# Patient Record
Sex: Female | Born: 1960 | Race: White | Hispanic: No | Marital: Single | Smoking: Never smoker
Health system: Southern US, Community
[De-identification: ages and names within clinical notes are randomized; demographics above are authoritative.]

## PROBLEM LIST (undated history)

## (undated) DIAGNOSIS — F32A Depression, unspecified: Secondary | ICD-10-CM

## (undated) DIAGNOSIS — F329 Major depressive disorder, single episode, unspecified: Secondary | ICD-10-CM

## (undated) HISTORY — DX: Depression, unspecified: F32.A

## (undated) HISTORY — PX: HERNIA REPAIR: SHX51

## (undated) HISTORY — DX: Major depressive disorder, single episode, unspecified: F32.9

## (undated) HISTORY — PX: CHOLECYSTECTOMY: SHX55

## (undated) HISTORY — PX: TEMPOROMANDIBULAR JOINT SURGERY: SHX35

## (undated) HISTORY — PX: APPENDECTOMY: SHX54

## (undated) HISTORY — PX: EYE SURGERY: SHX253

---

## 2016-12-18 ENCOUNTER — Emergency Department (INDEPENDENT_AMBULATORY_CARE_PROVIDER_SITE_OTHER)
Admission: EM | Admit: 2016-12-18 | Discharge: 2016-12-18 | Disposition: A | Discharge status: Home / Self Care | Payer: PRIVATE HEALTH INSURANCE | Source: Home / Self Care | Attending: Family Medicine | Admitting: Family Medicine

## 2016-12-18 DIAGNOSIS — L03313 Cellulitis of chest wall: Secondary | ICD-10-CM | POA: Diagnosis not present

## 2016-12-18 LAB — POCT CBC W AUTO DIFF (K'VILLE URGENT CARE)

## 2016-12-18 MED ORDER — CLINDAMYCIN HCL 300 MG PO CAPS: 300.0000 mg | ORAL_CAPSULE | Freq: Three times a day (TID) | ORAL | 0 refills | Status: AC

## 2016-12-18 MED ORDER — HYDROCODONE-ACETAMINOPHEN 5-325 MG PO TABS: 1.0000 | ORAL_TABLET | Freq: Four times a day (QID) | ORAL | 0 refills | Status: DC | PRN

## 2016-12-18 NOTE — Discharge Instructions (Signed)
Apply heating pad several times daily. If symptoms become significantly worse during the night or over the weekend, proceed to the local emergency room.

## 2016-12-18 NOTE — ED Provider Notes (Signed)
[name]    CSN: [id number] Arrival date & time: 12/18/16  1537     History   Chief Complaint Chief Complaint  Patient presents with  . Sore    under right arm    HPI Ellen Hammond is a 56 y.o. female.   Patient awoke two days ago with a sore area on her right lateral chest beneath her right axilla.  The area has gradually become more painful and red.  There has been no drainage from the area.  She has not felt well today.   The history is provided by the patient.  Abscess  Location:  Torso Torso abscess location:  R chest Size:  10cm by 6cm Abscess quality: induration, painful, redness and warmth   Abscess quality: not draining, no fluctuance, no itching and not weeping   Red streaking: no   Duration:  2 days Progression:  Worsening Pain details:    Quality:  Dull and pressure   Severity:  Mild   Duration:  2 days   Timing:  Constant   Progression:  Worsening Chronicity:  New Context: not skin injury   Relieved by:  Nothing Exacerbated by: contact. Ineffective treatments:  Topical antibiotics Associated symptoms: anorexia and fatigue   Associated symptoms: no fever, no headaches, no nausea and no vomiting     History reviewed. No pertinent past medical history.  There are no active problems to display for this patient.   Past Surgical History:  Procedure Laterality Date  . APPENDECTOMY    . CHOLECYSTECTOMY    . EYE SURGERY    . HERNIA REPAIR    . TEMPOROMANDIBULAR JOINT SURGERY      OB History    No data available       Home Medications    Prior to Admission medications   Medication Sig Start Date End Date Taking? Authorizing Provider  esomeprazole (NEXIUM) 10 MG packet Take 10 mg by mouth daily before breakfast.   Yes [provider]  clindamycin (CLEOCIN) 300 MG capsule Take 1 capsule (300 mg total) by mouth 3 (three) times daily. (every 8 hours) 12/18/16 12/28/16  Kandra Nicolas, MD  HYDROcodone-acetaminophen  (NORCO/VICODIN) 5-325 MG tablet Take 1 tablet by mouth every 6 (six) hours as needed for moderate pain. 12/18/16   Kandra Nicolas, MD    Family History Family History  Problem Relation Age of Onset  . Diabetes Mother   . Hypertension Mother   . Epilepsy Sister     Social History Social History  Substance Use Topics  . Smoking status: Never Smoker  . Smokeless tobacco: Never Used  . Alcohol use No     Allergies   Erythromycin and Penicillins   Review of Systems Review of Systems  Constitutional: Positive for fatigue. Negative for fever.  Gastrointestinal: Positive for anorexia. Negative for nausea and vomiting.  Neurological: Negative for headaches.  All other systems reviewed and are negative.    Physical Exam Triage Vital Signs ED Triage Vitals  Enc Vitals Group     BP 12/18/16 1605 (!) 153/82     Pulse Rate 12/18/16 1605 88     Resp --      Temp 12/18/16 1605 99.2 F (37.3 C)     Temp Source 12/18/16 1605 Oral     SpO2 12/18/16 1605 96 %     Weight 12/18/16 1605 238 lb (108 kg)     Height 12/18/16 1605 5\' 4"  (1.626 m)  Head Circumference --      Peak Flow --      Pain Score 12/18/16 1606 8     Pain Loc --      Pain Edu? --      Excl. in [place]? --    No data found.   Updated Vital Signs BP (!) 153/82 (BP Location: Left Arm)   Pulse 88   Temp 99.2 F (37.3 C) (Oral)   Ht 5\' 4"  (1.626 m)   Wt 238 lb (108 kg)   SpO2 96%   BMI 40.85 kg/m   Visual Acuity Right Eye Distance:   Left Eye Distance:   Bilateral Distance:    Right Eye Near:   Left Eye Near:    Bilateral Near:     Physical Exam  Constitutional: She appears well-developed and well-nourished. No distress.  HENT:  Head: Normocephalic.  Right Ear: External ear normal.  Left Ear: External ear normal.  Nose: Nose normal.  Mouth/Throat: Oropharynx is clear and moist.  Eyes: Conjunctivae are normal. Pupils are equal, round, and reactive to light.  Neck: Neck supple.  Cardiovascular:  Normal rate.   Pulmonary/Chest: Effort normal. She exhibits tenderness.    Right lateral chest immediately inferior to the right axilla has an indurated warm erythematous tender area about 6cm by 10cm.  No fluctuance, and minimal swelling.  No axillary adenopathy.  Lymphadenopathy:    She has no cervical adenopathy.  Neurological: She is alert.  Skin: Skin is warm and dry.  Nursing note and vitals reviewed.    UC Treatments / Results  Labs (all labs ordered are listed, but only abnormal results are displayed) Labs Reviewed  POCT CBC W AUTO DIFF ([place]):  WBC 15.1; LY 15.2; MO 4.7; GR 80.1; Hgb 12.9; Platelets 270     EKG  EKG Interpretation None       Radiology No results found.  Procedures Procedures (including critical care time)  Medications Ordered in UC Medications - No data to display   Initial Impression / Assessment and Plan / UC Course  I have reviewed the triage vital signs and the nursing notes.  Pertinent labs & imaging results that were available during my care of the patient were reviewed by me and considered in my medical decision making (see chart for details).    Note leukocytosis 15.1; lesion is indurated but not fluctuant (may eventually need I and D). Begin clindamycin 300mg  TID for staph coverage. Rx for Lortab for pain Apply heating pad several times daily. If symptoms become significantly worse during the night or over the weekend, proceed to the local emergency room.  Followup with Family Doctor if not improved in about 5 days.    Final Clinical Impressions(s) / UC Diagnoses   Final diagnoses:  Cellulitis of chest wall    New Prescriptions New Prescriptions   CLINDAMYCIN (CLEOCIN) 300 MG CAPSULE    Take 1 capsule (300 mg total) by mouth 3 (three) times daily. (every 8 hours)   HYDROCODONE-ACETAMINOPHEN (NORCO/VICODIN) 5-325 MG TABLET    Take 1 tablet by mouth every 6 (six) hours as needed for moderate pain.       Kandra Nicolas, MD 12/18/16 [phone]

## 2016-12-18 NOTE — ED Triage Notes (Signed)
Pt stated that spot appeared under right arm on Tuesday.  It is hot, red, painful, but denies drainage.  Area of redness is approximately 4"x4".  Pt has tried cortisone cream and is taking aleve for the pain.

## 2016-12-19 ENCOUNTER — Emergency Department: Admission: EM | Admit: 2016-12-19 | Discharge: 2016-12-19 | Discharge status: Absent without leave | Payer: Self-pay

## 2016-12-22 ENCOUNTER — Telehealth: Payer: Self-pay | Admitting: *Deleted

## 2016-12-22 NOTE — Telephone Encounter (Signed)
Pt called reports that her abscess is more swollen, red and hard to the touch. Advised her to return to the clinic for reevaluation. Pt agrees.

## 2017-02-11 ENCOUNTER — Emergency Department (INDEPENDENT_AMBULATORY_CARE_PROVIDER_SITE_OTHER): Payer: PRIVATE HEALTH INSURANCE

## 2017-02-11 ENCOUNTER — Encounter: Payer: Self-pay | Admitting: Emergency Medicine

## 2017-02-11 ENCOUNTER — Emergency Department (INDEPENDENT_AMBULATORY_CARE_PROVIDER_SITE_OTHER)
Admission: EM | Admit: 2017-02-11 | Discharge: 2017-02-11 | Disposition: A | Discharge status: Home / Self Care | Payer: PRIVATE HEALTH INSURANCE | Source: Home / Self Care | Attending: Family Medicine | Admitting: Family Medicine

## 2017-02-11 DIAGNOSIS — K760 Fatty (change of) liver, not elsewhere classified: Secondary | ICD-10-CM

## 2017-02-11 DIAGNOSIS — R1031 Right lower quadrant pain: Secondary | ICD-10-CM | POA: Diagnosis not present

## 2017-02-11 DIAGNOSIS — N200 Calculus of kidney: Secondary | ICD-10-CM

## 2017-02-11 LAB — POCT URINALYSIS DIP (MANUAL ENTRY)
Glucose, UA: NEGATIVE mg/dL
Leukocytes, UA: NEGATIVE
NITRITE UA: NEGATIVE
PH UA: 5.5 (ref 5.0–8.0)
Protein Ur, POC: 100 mg/dL — AB
Spec Grav, UA: >=1.03 — AB (ref 1.010–1.025)
Urobilinogen, UA: 0.2 E.U./dL

## 2017-02-11 LAB — POCT CBC W AUTO DIFF (K'VILLE URGENT CARE)

## 2017-02-11 MED ORDER — KETOROLAC TROMETHAMINE 60 MG/2ML IM SOLN
60.0000 mg | Freq: Once | INTRAMUSCULAR | Status: AC
  Administered 2017-02-11: 60 mg via INTRAMUSCULAR

## 2017-02-11 MED ORDER — IOPAMIDOL (ISOVUE-300) INJECTION 61%: 100.0000 mL | Freq: Once | INTRAVENOUS | Status: DC | PRN

## 2017-02-11 NOTE — ED Triage Notes (Signed)
Lower Right Quadrant pain since yesterday. Might be constipated, slight urinary problems?

## 2017-02-11 NOTE — ED Provider Notes (Addendum)
[facility]    CSN: [id number] Arrival date & time: 02/11/17  1152     History   Chief Complaint Chief Complaint  Patient presents with  . Abdominal Pain    HPI Ellen Hammond is a 56 y.o. female.   At 3 pm yesterday patient developed crampy non-radiating right lower abdominal pain.  The pain has persisted and is worse when walking, and better when still.  She has had sweats, and nausea without vomiting.  Her pain has not been related to eating.  She had normal bowel movements until yesterday (none since then), and no urinary symptoms.  Her urine has been lighter than normal.  She had similar pain one month ago that lasted about 3 hours.  Her pain has not improved after taking Pepto Bismol, Nexium, and Aleve. She has a past history of appendectomy and cholecystectomy.  She has a hiatal hernia.  She is 5 years post-menopausal.   The history is provided by the patient.  Abdominal Pain  Pain location:  RLQ Pain quality: cramping   Pain radiates to:  Does not radiate Pain severity:  Mild Onset quality:  Sudden Duration:  1 day Timing:  Constant Progression:  Unchanged Chronicity:  Recurrent Context: previous surgery   Context: not alcohol use, not awakening from sleep, not diet changes, not eating, not laxative use, not medication withdrawal, not recent illness, not recent travel, not sick contacts, not suspicious food intake and not trauma   Relieved by:  Nothing Exacerbated by: walking. Ineffective treatments:  NSAIDs and OTC medications Associated symptoms: chills and nausea   Associated symptoms: no anorexia, no belching, no chest pain, no constipation, no cough, no diarrhea, no dysuria, no fatigue, no fever, no flatus, no hematemesis, no hematochezia, no hematuria, no melena, no shortness of breath, no sore throat, no vaginal bleeding, no vaginal discharge and no vomiting   Risk factors: multiple surgeries and obesity     History reviewed. No pertinent past  medical history.  There are no active problems to display for this patient.   Past Surgical History:  Procedure Laterality Date  . APPENDECTOMY    . CHOLECYSTECTOMY    . EYE SURGERY    . HERNIA REPAIR    . TEMPOROMANDIBULAR JOINT SURGERY      OB History    No data available       Home Medications    Prior to Admission medications   Medication Sig Start Date End Date Taking? Authorizing Provider  esomeprazole (NEXIUM) 10 MG packet Take 10 mg by mouth daily before breakfast.    [provider]    Family History Family History  Problem Relation Age of Onset  . Diabetes Mother   . Hypertension Mother   . Epilepsy Sister     Social History Social History  Substance Use Topics  . Smoking status: Never Smoker  . Smokeless tobacco: Never Used  . Alcohol use No     Allergies   Erythromycin and Penicillins   Review of Systems Review of Systems  Constitutional: Positive for chills. Negative for fatigue and fever.  HENT: Negative for sore throat.   Respiratory: Negative for cough and shortness of breath.   Cardiovascular: Negative for chest pain.  Gastrointestinal: Positive for abdominal pain and nausea. Negative for anorexia, constipation, diarrhea, flatus, hematemesis, hematochezia, melena and vomiting.  Genitourinary: Negative for dysuria, hematuria, vaginal bleeding and vaginal discharge.     Physical Exam Triage Vital Signs ED Triage Vitals  Enc Vitals  Group     BP 02/11/17 1228 (!) 158/99     Pulse Rate 02/11/17 1228 81     Resp --      Temp 02/11/17 1228 98.3 F (36.8 C)     Temp Source 02/11/17 1228 Oral     SpO2 02/11/17 1228 96 %     Weight 02/11/17 1229 242 lb (109.8 kg)     Height 02/11/17 1229 5\' 4"  (1.626 m)     Head Circumference --      Peak Flow --      Pain Score 02/11/17 1229 8     Pain Loc --      Pain Edu? --      Excl. in [place]? --    No data found.   Updated Vital Signs BP (!) 158/99 (BP Location: Left Arm)   Pulse  81   Temp 98.3 F (36.8 C) (Oral)   Ht 5\' 4"  (1.626 m)   Wt 242 lb (109.8 kg)   SpO2 96%   BMI 41.54 kg/m   Visual Acuity Right Eye Distance:   Left Eye Distance:   Bilateral Distance:    Right Eye Near:   Left Eye Near:    Bilateral Near:     Physical Exam  Constitutional: She appears well-developed and well-nourished. No distress.  HENT:  Head: Normocephalic.  Right Ear: External ear normal.  Left Ear: External ear normal.  Nose: Nose normal.  Mouth/Throat: Oropharynx is clear and moist.  Eyes: Pupils are equal, round, and reactive to light. Conjunctivae and EOM are normal.  Neck: Neck supple.  Cardiovascular: Normal heart sounds.   Pulmonary/Chest: Breath sounds normal.  Abdominal: Soft. Bowel sounds are normal. She exhibits no distension and no mass. There is no hepatosplenomegaly. There is tenderness. There is no rigidity, no rebound, no guarding, no tenderness at McBurney's point and negative Murphy's sign. No hernia.    Mild right flank tenderness also present.  Musculoskeletal: She exhibits no edema.  Lymphadenopathy:    She has no cervical adenopathy.  Neurological: She is alert.  Skin: Skin is warm and dry. No rash noted.  Nursing note and vitals reviewed.    UC Treatments / Results  Labs (all labs ordered are listed, but only abnormal results are displayed) Labs Reviewed  POCT CBC W AUTO DIFF ([facility]):  WBC 9.1; LY 29.0; MO 3.8; GR 67.2; Hgb 13.7; Platelets 284 POCT urinalysis:  GLU negative, BIL small, KET trace, SG >= 1.030, BLO trace intact, pH 5.5, PRO 100mg /dL, URO 0.2 E.U./dL, NIT negative, LEU negative.     EKG  EKG Interpretation None       Radiology Study Result   CLINICAL DATA:  Left lower quadrant pain and tenderness.  EXAM: CT ABDOMEN AND PELVIS WITHOUT CONTRAST  TECHNIQUE: Multidetector CT imaging of the abdomen and pelvis was performed following the standard protocol without IV contrast.  COMPARISON:   None.  FINDINGS: Lower chest: Unremarkable.  Hepatobiliary: The liver shows diffusely decreased attenuation suggesting steatosis. No focal abnormality within the liver parenchyma. Gallbladder surgically absent. No intrahepatic or extrahepatic biliary dilation.  Pancreas: No focal mass lesion. No dilatation of the main duct. No intraparenchymal cyst. No peripancreatic edema.  Spleen: No splenomegaly. No focal mass lesion.  Adrenals/Urinary Tract: No adrenal nodule or mass. Right kidney unremarkable. 2 mm nonobstructing interpolar left renal stone has an adjacent 1 mm nonobstructing stone. No evidence for hydroureter. The urinary bladder appears normal for the degree of distention.  Stomach/Bowel:  Small hiatal hernia. Stomach otherwise unremarkable. Duodenum is normally positioned as is the ligament of Treitz. No small bowel wall thickening. No small bowel dilatation. The terminal ileum is normal. The appendix is not visualized, but there is no edema or inflammation in the region of the cecum. Left colon is decompressed, but no pericolonic edema or inflammation evident.  Vascular/Lymphatic: No abdominal aortic aneurysm. There is no gastrohepatic or hepatoduodenal ligament lymphadenopathy. No intraperitoneal or retroperitoneal lymphadenopathy. No pelvic sidewall lymphadenopathy.  Reproductive: Uterus normal in appearance. There is no adnexal mass.  Other: Trace amount of free fluid is identified in the cul-de-sac.  Musculoskeletal: Bone windows reveal no worrisome lytic or sclerotic osseous lesions. Bilateral groin hernias contain only fat.  IMPRESSION: 1. No acute findings in the abdomen or pelvis. 2. Trace free fluid in the cul-de-sac. This can be physiologic in a pre or perimenopausal female but is considered abnormal in a postmenopausal patient. Etiology for the fluid not evident on the current CT scan. 3. Hepatic steatosis. 4. Nonobstructing left  nephrolithiasis. 5. Bilateral groin hernias contain only fat.   Electronically Signed   By: Misty Stanley M.D.   On: 02/11/2017 14:28     Procedures Procedures (including critical care time)  Medications Ordered in UC Medications  ketorolac (TORADOL) injection 60 mg (60 mg Intramuscular Given 02/11/17 1709)     Initial Impression / Assessment and Plan / UC Course  I have reviewed the triage vital signs and the nursing notes.  Pertinent labs & imaging results that were available during my care of the patient were reviewed by me and considered in my medical decision making (see chart for details).    Normal CBC reassuring. Urinalysis non-specific but not normal (small BIL, trace KET, trace BLO, PRO 100mg /dL) Concern for trace free fluid in the cul-de-sac without obvious cause in a postmenopausal patient. Rx for Mobic 15mg , one daily (hand written) and Lortab 5-325, one Q6hr prn, #15, no refill (hand written).  Controlled Substance Prescriptions I have consulted the [place] Controlled Substances Registry for this patient, and feel the risk/benefit ratio today is favorable for proceeding with this prescription for a controlled substance.   Recommend follow-up with GYN for further evaluation.    Final Clinical Impressions(s) / UC Diagnoses   Final diagnoses:  Right lower quadrant abdominal pain  Right lower quadrant abdominal pain of unknown etiology    New Prescriptions Discharge Medication List as of 02/11/2017  5:11 PM           Kandra Nicolas, MD 02/23/17 1329    Kandra Nicolas, MD 02/26/17 1137

## 2017-02-23 NOTE — Discharge Instructions (Signed)
If symptoms become significantly worse during the night or over the weekend, proceed to the local emergency room.  

## 2017-02-26 ENCOUNTER — Telehealth: Payer: Self-pay | Admitting: *Deleted

## 2017-02-26 NOTE — Telephone Encounter (Signed)
Patient reports pain is still present. She has a follow up appointment with GYN on 03/03/17.

## 2017-03-03 ENCOUNTER — Encounter: Payer: Self-pay | Admitting: Obstetrics & Gynecology

## 2017-03-03 ENCOUNTER — Ambulatory Visit: Payer: PRIVATE HEALTH INSURANCE | Admitting: Obstetrics & Gynecology

## 2017-03-03 VITALS — BP 156/93 | HR 82 | Resp 16 | Ht 64.0 in | Wt 243.0 lb

## 2017-03-03 DIAGNOSIS — R1031 Right lower quadrant pain: Secondary | ICD-10-CM

## 2017-03-03 NOTE — Progress Notes (Signed)
   Subjective:    Patient ID: Ellen Hammond, female    DOB: [birth year], 56 y.o.   MRN: [id number]  HPI 56 yo MW P0 here today for a referral after a CT was done for RLQ pain. It had an incidental finding of trace fluid in the posterior cul de sac.  She reports RLQ pain for about 2 months. 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 Her CT did show bilateral groin hernias. She has had an appendectomy and cholecystetomy.  Review of Systems Last pap about 2 years ago at [facility] in [place]    Objective:   Physical Exam Well nourished, well hydrated white female, no apparent distress Breathing, conversing, and ambulating normally Abd- obese, several scars, benign     Assessment & Plan:  2 month h/o intermittent RLQ pain- I don't think that this gyn in origin. I will refer her to general surgery.

## 2017-03-10 ENCOUNTER — Telehealth: Payer: Self-pay | Admitting: *Deleted

## 2017-03-10 DIAGNOSIS — R102 Pelvic and perineal pain: Secondary | ICD-10-CM

## 2017-03-10 NOTE — Telephone Encounter (Signed)
Pt notified that Dr Hulan Fray would like to have her have a pelvic U/S in 1 month.  Orders placed in Epic.

## 2017-03-10 NOTE — Telephone Encounter (Signed)
-----   Message from Emily Filbert, MD sent at 03/05/2017  5:25 PM EDT ----- Lets do the u/s in a month, not 3 months.

## 2017-04-08 ENCOUNTER — Other Ambulatory Visit (HOSPITAL_COMMUNITY): Payer: Self-pay | Admitting: Obstetrics & Gynecology

## 2017-04-08 DIAGNOSIS — R102 Pelvic and perineal pain: Secondary | ICD-10-CM

## 2017-04-09 ENCOUNTER — Other Ambulatory Visit: Payer: PRIVATE HEALTH INSURANCE

## 2017-04-09 ENCOUNTER — Ambulatory Visit (HOSPITAL_BASED_OUTPATIENT_CLINIC_OR_DEPARTMENT_OTHER)
Admission: RE | Admit: 2017-04-09 | Discharge: 2017-04-09 | Disposition: A | Discharge status: Home / Self Care | Payer: PRIVATE HEALTH INSURANCE | Source: Ambulatory Visit | Attending: Obstetrics & Gynecology | Admitting: Obstetrics & Gynecology

## 2017-04-09 ENCOUNTER — Encounter (HOSPITAL_BASED_OUTPATIENT_CLINIC_OR_DEPARTMENT_OTHER): Payer: Self-pay

## 2017-04-09 DIAGNOSIS — R102 Pelvic and perineal pain: Secondary | ICD-10-CM | POA: Insufficient documentation

## 2017-04-09 DIAGNOSIS — D259 Leiomyoma of uterus, unspecified: Secondary | ICD-10-CM | POA: Insufficient documentation

## 2017-04-15 ENCOUNTER — Telehealth: Payer: Self-pay | Admitting: *Deleted

## 2017-04-15 NOTE — Telephone Encounter (Signed)
Pt notified of U/S results per Dr Hulan Fray.  Pt to call office if she does not have the referral for a general surgeon.

## 2017-05-21 ENCOUNTER — Encounter: Payer: Self-pay | Admitting: Obstetrics & Gynecology

## 2018-08-10 IMAGING — CT CT ABD-PELV W/O CM
2 of 5 series · 15 of 46 positions shown, 17 images · non-contrast
Comparison: None.

CLINICAL DATA: Left lower quadrant pain and tenderness.

EXAM:
CT ABDOMEN AND PELVIS WITHOUT CONTRAST
TECHNIQUE: Multidetector CT imaging of the abdomen and pelvis was performed
following the standard protocol without IV contrast.

[Series 2: axial st · axial · 0.79mm/px · z∈[-524,-84]mm · 12 of 105 slices shown, 14 images]
[im 9/105  soft-tissue]
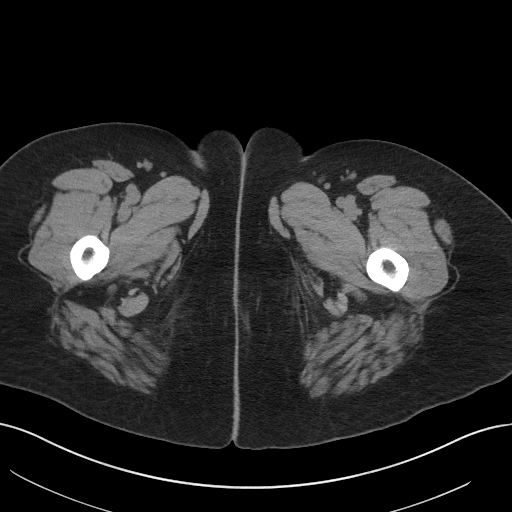
[im 9/105  bone]
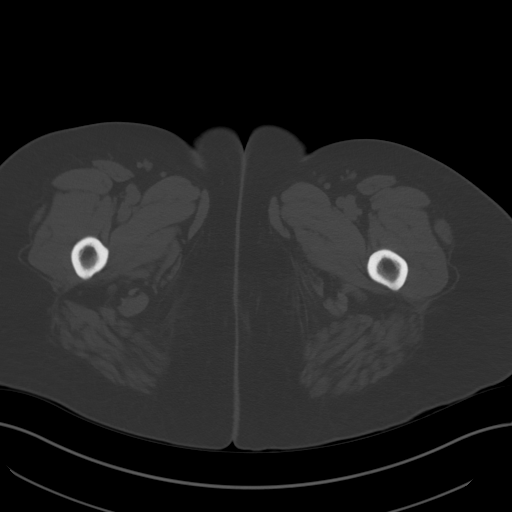
[im 17/105  soft-tissue]
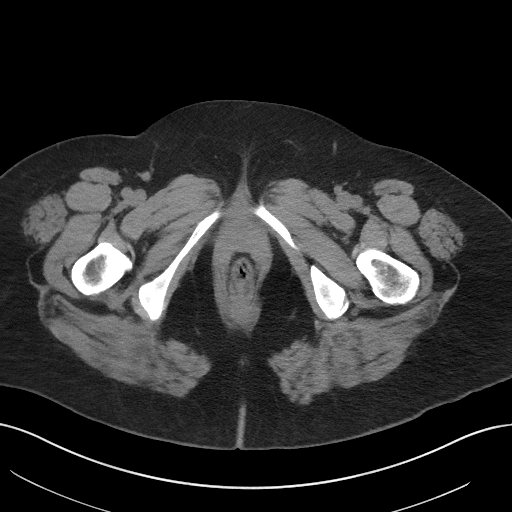
[im 25/105  soft-tissue]
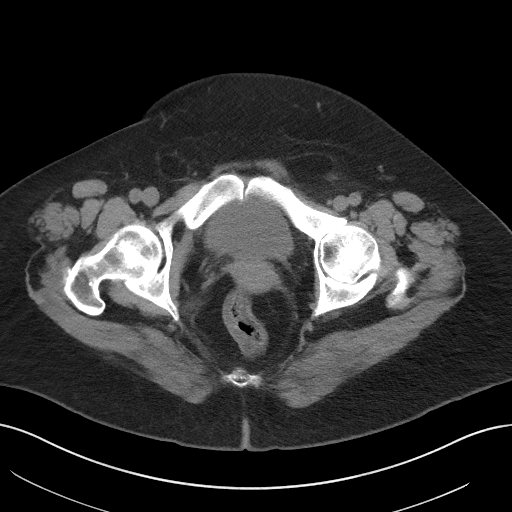
[im 33/105  soft-tissue]
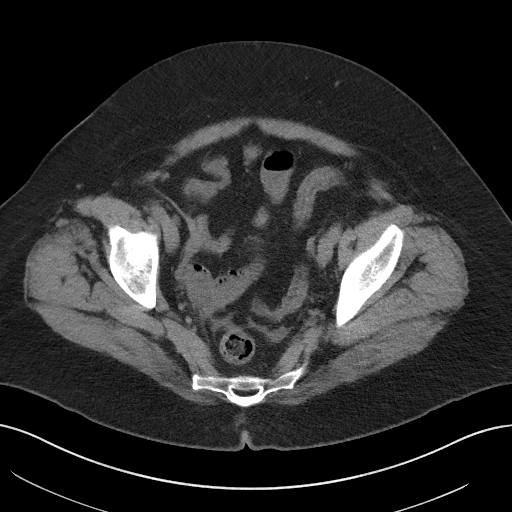
[im 41/105  soft-tissue]
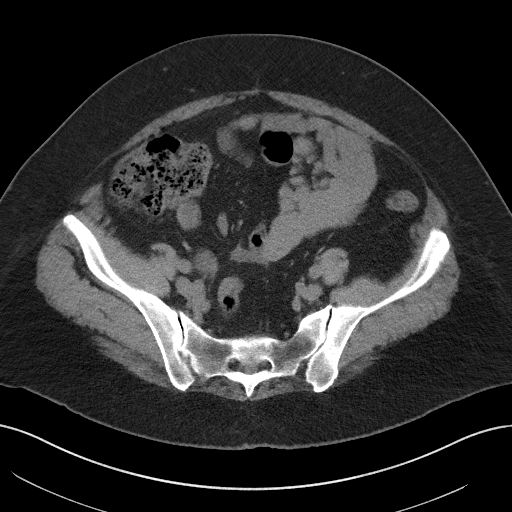
[im 49/105  soft-tissue]
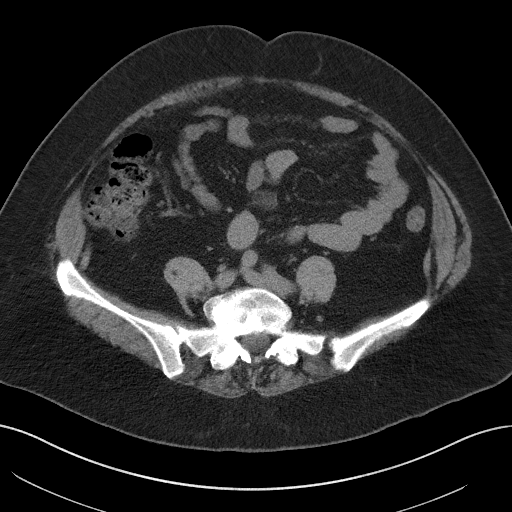
[im 57/105  soft-tissue]
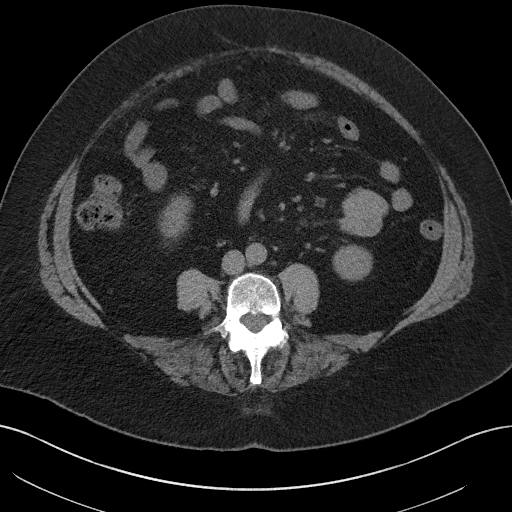
[im 65/105  soft-tissue]
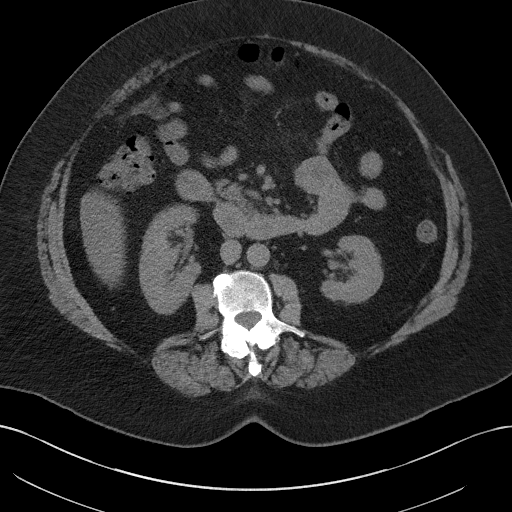
[im 73/105  soft-tissue]
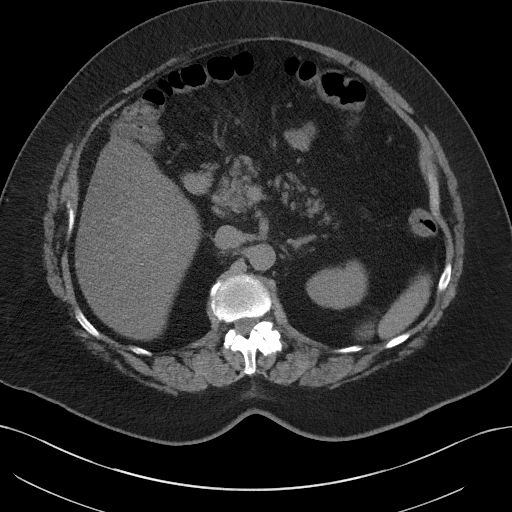
[im 73/105  bone]
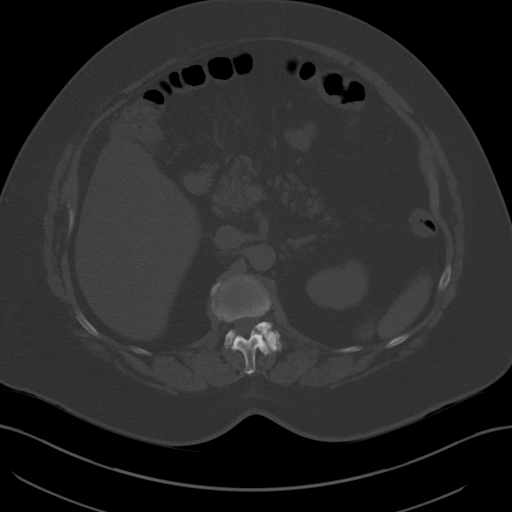
[im 81/105  soft-tissue]
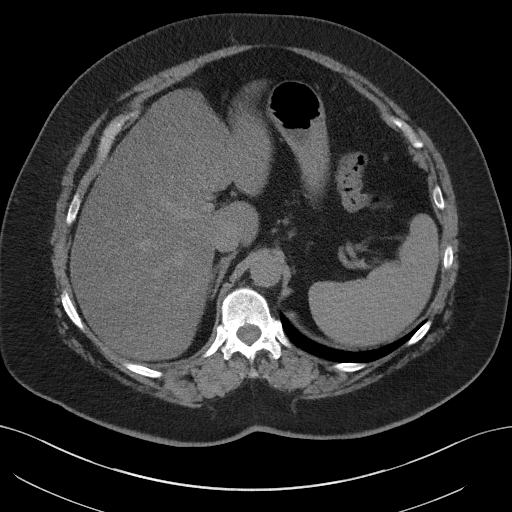
[im 89/105  soft-tissue]
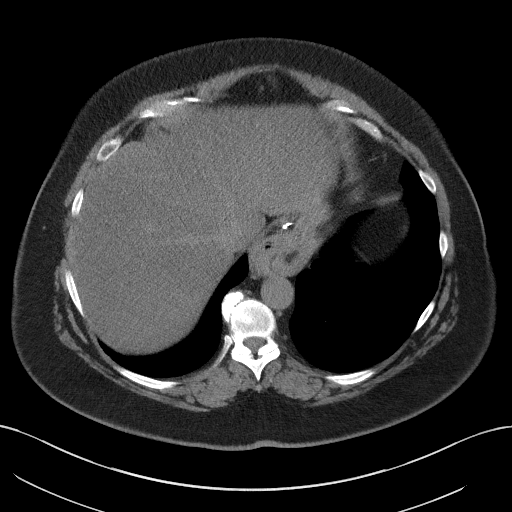
[im 97/105  soft-tissue]
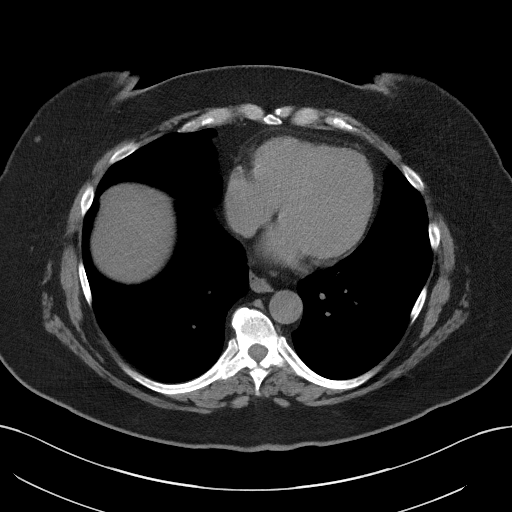

[Series 7: coronal st · coronal · 1.00mm/px · 3 of 123 slices shown]
[im 41/123  soft-tissue]
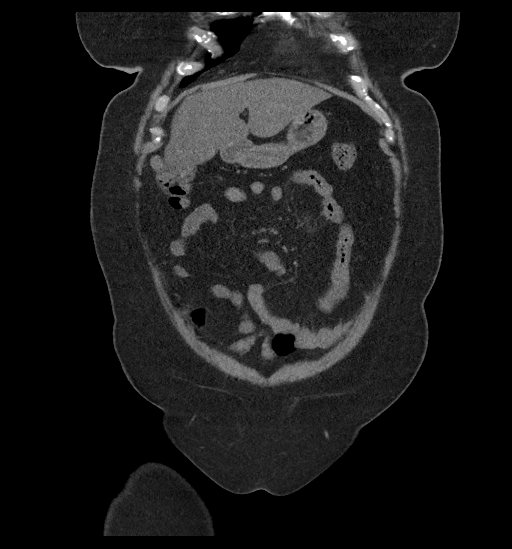
[im 55/123  soft-tissue]
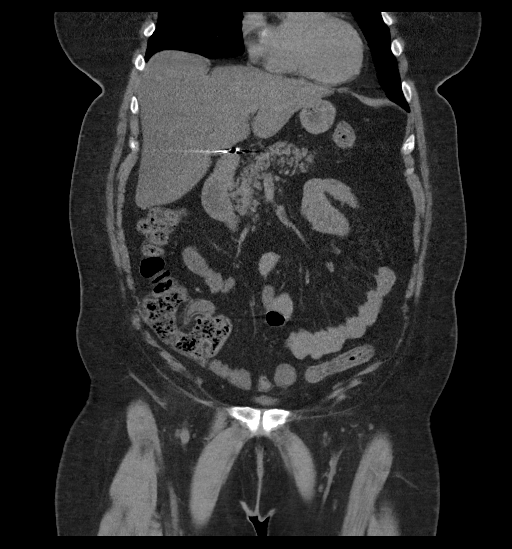
[im 68/123  soft-tissue]
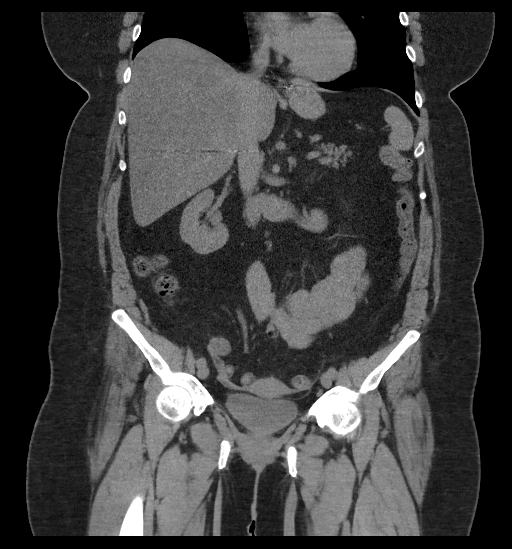

[15 of 46 positions shown; findings below may reference images not displayed]

FINDINGS: Lower chest: Unremarkable.

Hepatobiliary: The liver shows diffusely decreased attenuation
suggesting steatosis. No focal abnormality within the liver
parenchyma. Gallbladder surgically absent. No intrahepatic or
extrahepatic biliary dilation.

Pancreas: No focal mass lesion. No dilatation of the main duct. No
intraparenchymal cyst. No peripancreatic edema.

Spleen: No splenomegaly. No focal mass lesion.

Adrenals/Urinary Tract: No adrenal nodule or mass. Right kidney
unremarkable. 2 mm nonobstructing interpolar left renal stone has an
adjacent 1 mm nonobstructing stone. No evidence for hydroureter. The
urinary bladder appears normal for the degree of distention.

Stomach/Bowel: Small hiatal hernia. Stomach otherwise unremarkable.
Duodenum is normally positioned as is the ligament of Treitz. No
small bowel wall thickening. No small bowel dilatation. The terminal
ileum is normal. The appendix is not visualized, but there is no
edema or inflammation in the region of the cecum. Left colon is
decompressed, but no pericolonic edema or inflammation evident.

Vascular/Lymphatic: No abdominal aortic aneurysm. There is no
gastrohepatic or hepatoduodenal ligament lymphadenopathy. No
intraperitoneal or retroperitoneal lymphadenopathy. No pelvic
sidewall lymphadenopathy.

Reproductive: Uterus normal in appearance. There is no adnexal mass.

Other: Trace amount of free fluid is identified in the cul-de-sac.

Musculoskeletal: Bone windows reveal no worrisome lytic or sclerotic
osseous lesions. Bilateral groin hernias contain only fat.
IMPRESSION: 1. No acute findings in the abdomen or pelvis.
2. Trace free fluid in the cul-de-sac. This can be physiologic in a
pre or perimenopausal female but is considered abnormal in a
postmenopausal patient. Etiology for the fluid not evident on the
current CT scan.
3. Hepatic steatosis.
4. Nonobstructing left nephrolithiasis.
5. Bilateral groin hernias contain only fat.

## 2024-03-16 ENCOUNTER — Other Ambulatory Visit (HOSPITAL_BASED_OUTPATIENT_CLINIC_OR_DEPARTMENT_OTHER): Payer: Self-pay

## 2024-03-16 MED ORDER — FLUZONE 0.5 ML IM SUSY
0.5000 mL | PREFILLED_SYRINGE | INTRAMUSCULAR | 0 refills | Status: AC
  Filled 2024-03-16: qty 0.5, 1d supply, fill #0
